# Patient Record
Sex: Male | Born: 1935 | Race: White | Hispanic: No
Health system: Southern US, Community
[De-identification: ages and names within clinical notes are randomized; demographics above are authoritative.]

---

## 2006-11-15 ENCOUNTER — Ambulatory Visit: Payer: Self-pay | Admitting: Ophthalmology

## 2011-01-15 ENCOUNTER — Inpatient Hospital Stay: Payer: Self-pay | Admitting: Internal Medicine

## 2011-03-03 ENCOUNTER — Ambulatory Visit: Payer: Self-pay | Admitting: Internal Medicine

## 2011-03-26 ENCOUNTER — Other Ambulatory Visit: Payer: Self-pay | Admitting: Family Medicine

## 2011-03-26 LAB — URINALYSIS, COMPLETE
Bilirubin,UR: NEGATIVE
Glucose,UR: NEGATIVE mg/dL (ref 0–75)
Ph: 8 (ref 4.5–8.0)
Specific Gravity: 1.017 (ref 1.003–1.030)
Squamous Epithelial: NONE SEEN
WBC UR: 1424 /HPF (ref 0–5)

## 2011-03-29 ENCOUNTER — Inpatient Hospital Stay: Payer: Self-pay | Admitting: Internal Medicine

## 2011-03-29 DIAGNOSIS — E875 Hyperkalemia: Secondary | ICD-10-CM

## 2011-03-29 LAB — CBC
HCT: 46 % (ref 40.0–52.0)
HGB: 15.6 g/dL (ref 13.0–18.0)
MCH: 32.9 pg (ref 26.0–34.0)
MCHC: 33.9 g/dL (ref 32.0–36.0)
Platelet: 266 10*3/uL (ref 150–440)
RBC: 4.73 10*6/uL (ref 4.40–5.90)

## 2011-03-29 LAB — BASIC METABOLIC PANEL
Calcium, Total: 8.7 mg/dL (ref 8.5–10.1)
Chloride: >128 mmol/L — ABNORMAL HIGH (ref 98–107)
Co2: 20 mmol/L — ABNORMAL LOW (ref 21–32)
Creatinine: 7.36 mg/dL — ABNORMAL HIGH (ref 0.60–1.30)
EGFR (African American): 9 — ABNORMAL LOW
EGFR (Non-African Amer.): 8 — ABNORMAL LOW

## 2011-03-29 LAB — URINALYSIS, COMPLETE
Glucose,UR: NEGATIVE mg/dL (ref 0–75)
Nitrite: NEGATIVE
Ph: 7 (ref 4.5–8.0)
Protein: 100
RBC,UR: 347 /HPF (ref 0–5)
Specific Gravity: 1.017 (ref 1.003–1.030)
WBC UR: 1050 /HPF (ref 0–5)

## 2011-03-29 LAB — COMPREHENSIVE METABOLIC PANEL
BUN: 145 mg/dL — ABNORMAL HIGH (ref 7–18)
Bilirubin,Total: 0.3 mg/dL (ref 0.2–1.0)
Calcium, Total: 9.2 mg/dL (ref 8.5–10.1)
Chloride: >128 mmol/L — ABNORMAL HIGH (ref 98–107)
Co2: 20 mmol/L — ABNORMAL LOW (ref 21–32)
Creatinine: 7.97 mg/dL — ABNORMAL HIGH (ref 0.60–1.30)
EGFR (African American): 8 — ABNORMAL LOW
EGFR (Non-African Amer.): 7 — ABNORMAL LOW
Glucose: 164 mg/dL — ABNORMAL HIGH (ref 65–99)
SGOT(AST): 31 U/L (ref 15–37)
SGPT (ALT): 30 U/L
Total Protein: 6.9 g/dL (ref 6.4–8.2)

## 2011-03-29 LAB — CK TOTAL AND CKMB (NOT AT ARMC): CK, Total: 104 U/L (ref 35–232)

## 2011-03-29 LAB — TROPONIN I
Troponin-I: 0.08 ng/mL — ABNORMAL HIGH
Troponin-I: 0.07 ng/mL — ABNORMAL HIGH

## 2011-03-29 LAB — TSH: Thyroid Stimulating Horm: 0.403 u[IU]/mL — ABNORMAL LOW

## 2011-03-30 LAB — CBC WITH DIFFERENTIAL/PLATELET
Basophil #: 0 10*3/uL (ref 0.0–0.1)
Eosinophil #: 0.1 10*3/uL (ref 0.0–0.7)
HCT: 40.8 % (ref 40.0–52.0)
Lymphocyte #: 1.7 10*3/uL (ref 1.0–3.6)
Lymphocyte %: 8 %
MCHC: 32.3 g/dL (ref 32.0–36.0)
MCV: 97 fL (ref 80–100)
Neutrophil #: 19 10*3/uL — ABNORMAL HIGH (ref 1.4–6.5)
Platelet: 239 10*3/uL (ref 150–440)
RBC: 4.21 10*6/uL — ABNORMAL LOW (ref 4.40–5.90)
RDW: 16.4 % — ABNORMAL HIGH (ref 11.5–14.5)

## 2011-03-30 LAB — BASIC METABOLIC PANEL
BUN: 133 mg/dL — ABNORMAL HIGH (ref 7–18)
Calcium, Total: 8.9 mg/dL (ref 8.5–10.1)
Chloride: >128 mmol/L — ABNORMAL HIGH (ref 98–107)
Co2: 21 mmol/L (ref 21–32)
Creatinine: 7.44 mg/dL — ABNORMAL HIGH (ref 0.60–1.30)
EGFR (African American): 12 — ABNORMAL LOW
EGFR (Non-African Amer.): 10 — ABNORMAL LOW
Glucose: 134 mg/dL — ABNORMAL HIGH (ref 65–99)
Glucose: 193 mg/dL — ABNORMAL HIGH (ref 65–99)
Potassium: 5.3 mmol/L — ABNORMAL HIGH (ref 3.5–5.1)
Potassium: 4.1 mmol/L (ref 3.5–5.1)
Sodium: >160 mmol/L (ref 136–145)
Sodium: >160 mmol/L (ref 136–145)

## 2011-03-30 LAB — TROPONIN I: Troponin-I: 0.06 ng/mL — ABNORMAL HIGH

## 2011-03-31 LAB — BASIC METABOLIC PANEL
Anion Gap: 15 (ref 7–16)
BUN: 107 mg/dL — ABNORMAL HIGH (ref 7–18)
Calcium, Total: 7.7 mg/dL — ABNORMAL LOW (ref 8.5–10.1)
Calcium, Total: 7.4 mg/dL — ABNORMAL LOW (ref 8.5–10.1)
Chloride: 121 mmol/L — ABNORMAL HIGH (ref 98–107)
Co2: 20 mmol/L — ABNORMAL LOW (ref 21–32)
Co2: 19 mmol/L — ABNORMAL LOW (ref 21–32)
Creatinine: 3.86 mg/dL — ABNORMAL HIGH (ref 0.60–1.30)
EGFR (African American): 16 — ABNORMAL LOW
EGFR (Non-African Amer.): 13 — ABNORMAL LOW
Glucose: 150 mg/dL — ABNORMAL HIGH (ref 65–99)
Osmolality: 351 (ref 275–301)
Potassium: 3.9 mmol/L (ref 3.5–5.1)
Potassium: 3.6 mmol/L (ref 3.5–5.1)
Sodium: 154 mmol/L — ABNORMAL HIGH (ref 136–145)

## 2011-04-02 LAB — RENAL FUNCTION PANEL
Albumin: 2.2 g/dL — ABNORMAL LOW (ref 3.4–5.0)
Anion Gap: 15 (ref 7–16)
BUN: 55 mg/dL — ABNORMAL HIGH (ref 7–18)
Calcium, Total: 7.9 mg/dL — ABNORMAL LOW (ref 8.5–10.1)
Co2: 16 mmol/L — ABNORMAL LOW (ref 21–32)
EGFR (African American): 32 — ABNORMAL LOW
Glucose: 138 mg/dL — ABNORMAL HIGH (ref 65–99)
Osmolality: 306 (ref 275–301)
Phosphorus: 3.2 mg/dL (ref 2.5–4.9)
Sodium: 145 mmol/L (ref 136–145)

## 2011-04-03 ENCOUNTER — Ambulatory Visit: Payer: Self-pay | Admitting: Internal Medicine

## 2011-04-03 LAB — BASIC METABOLIC PANEL
Anion Gap: 15 (ref 7–16)
Calcium, Total: 8.1 mg/dL — ABNORMAL LOW (ref 8.5–10.1)
Co2: 12 mmol/L — ABNORMAL LOW (ref 21–32)
EGFR (African American): 41 — ABNORMAL LOW
EGFR (Non-African Amer.): 34 — ABNORMAL LOW
Glucose: 105 mg/dL — ABNORMAL HIGH (ref 65–99)
Osmolality: 307 (ref 275–301)
Potassium: 3.7 mmol/L (ref 3.5–5.1)

## 2011-04-03 LAB — CULTURE, BLOOD (SINGLE)

## 2011-04-04 LAB — BASIC METABOLIC PANEL
Chloride: 120 mmol/L — ABNORMAL HIGH (ref 98–107)
Co2: 16 mmol/L — ABNORMAL LOW (ref 21–32)
Creatinine: 2.05 mg/dL — ABNORMAL HIGH (ref 0.60–1.30)
EGFR (Non-African Amer.): 33 — ABNORMAL LOW
Potassium: 4 mmol/L (ref 3.5–5.1)
Sodium: 146 mmol/L — ABNORMAL HIGH (ref 136–145)

## 2011-04-04 LAB — CBC WITH DIFFERENTIAL/PLATELET
Basophil #: 0 10*3/uL (ref 0.0–0.1)
Basophil %: 0.2 %
Eosinophil #: 0.2 10*3/uL (ref 0.0–0.7)
Lymphocyte #: 1.3 10*3/uL (ref 1.0–3.6)
MCH: 31.4 pg (ref 26.0–34.0)
MCHC: 33.4 g/dL (ref 32.0–36.0)
MCV: 94 fL (ref 80–100)
Monocyte #: 0.1 10*3/uL (ref 0.0–0.7)
Monocyte %: 1.8 %
Neutrophil #: 4.2 10*3/uL (ref 1.4–6.5)
Platelet: 139 10*3/uL — ABNORMAL LOW (ref 150–440)
RDW: 15.8 % — ABNORMAL HIGH (ref 11.5–14.5)

## 2011-04-05 LAB — BASIC METABOLIC PANEL
Anion Gap: 14 (ref 7–16)
Calcium, Total: 8.6 mg/dL (ref 8.5–10.1)
Chloride: 120 mmol/L — ABNORMAL HIGH (ref 98–107)
Co2: 15 mmol/L — ABNORMAL LOW (ref 21–32)
Creatinine: 2.06 mg/dL — ABNORMAL HIGH (ref 0.60–1.30)
EGFR (African American): 40 — ABNORMAL LOW
EGFR (Non-African Amer.): 33 — ABNORMAL LOW
Glucose: 116 mg/dL — ABNORMAL HIGH (ref 65–99)
Sodium: 149 mmol/L — ABNORMAL HIGH (ref 136–145)

## 2011-04-06 LAB — BASIC METABOLIC PANEL
BUN: 33 mg/dL — ABNORMAL HIGH (ref 7–18)
Calcium, Total: 8.4 mg/dL — ABNORMAL LOW (ref 8.5–10.1)
Chloride: 114 mmol/L — ABNORMAL HIGH (ref 98–107)
Co2: 17 mmol/L — ABNORMAL LOW (ref 21–32)
EGFR (African American): 36 — ABNORMAL LOW
EGFR (Non-African Amer.): 30 — ABNORMAL LOW
Glucose: 88 mg/dL (ref 65–99)
Osmolality: 294 (ref 275–301)
Potassium: 4 mmol/L (ref 3.5–5.1)
Sodium: 144 mmol/L (ref 136–145)

## 2011-04-06 LAB — CBC WITH DIFFERENTIAL/PLATELET
Basophil #: 0 x10 3/mm 3
Basophil %: 0.2 %
Eosinophil #: 0.2 x10 3/mm 3
Eosinophil %: 4.7 %
HCT: 28 % — ABNORMAL LOW
HGB: 9.7 g/dL — ABNORMAL LOW
Lymphocyte %: 30.6 %
Lymphs Abs: 1.5 x10 3/mm 3
MCH: 32.3 pg
MCHC: 34.4 g/dL
MCV: 94 fL
Monocyte #: 0.3 x10 3/mm 3
Monocyte %: 6.1 %
Neutrophil #: 3 x10 3/mm 3
Neutrophil %: 58.4 %
Platelet: 114 x10 3/mm 3 — ABNORMAL LOW
RBC: 2.99 x10 6/mm 3 — ABNORMAL LOW
RDW: 15 % — ABNORMAL HIGH
WBC: 5 x10 3/mm 3

## 2011-04-07 LAB — RENAL FUNCTION PANEL
Calcium, Total: 8.4 mg/dL — ABNORMAL LOW (ref 8.5–10.1)
Chloride: 111 mmol/L — ABNORMAL HIGH (ref 98–107)
Co2: 23 mmol/L (ref 21–32)
Glucose: 89 mg/dL (ref 65–99)
Phosphorus: 4.4 mg/dL (ref 2.5–4.9)
Potassium: 3.4 mmol/L — ABNORMAL LOW (ref 3.5–5.1)

## 2011-04-09 LAB — CBC WITH DIFFERENTIAL/PLATELET
Basophil %: 0.6 %
Eosinophil #: 0.2 10*3/uL (ref 0.0–0.7)
Eosinophil %: 3.2 %
HCT: 31.1 % — ABNORMAL LOW (ref 40.0–52.0)
HGB: 10.2 g/dL — ABNORMAL LOW (ref 13.0–18.0)
Lymphocyte %: 22.8 %
MCH: 30.9 pg (ref 26.0–34.0)
MCHC: 32.8 g/dL (ref 32.0–36.0)
MCV: 94 fL (ref 80–100)
Monocyte #: 0.5 10*3/uL (ref 0.0–0.7)
Neutrophil #: 3.4 10*3/uL (ref 1.4–6.5)
Neutrophil %: 63.5 %
RBC: 3.3 10*6/uL — ABNORMAL LOW (ref 4.40–5.90)

## 2011-04-09 LAB — BASIC METABOLIC PANEL
Anion Gap: 13 (ref 7–16)
BUN: 32 mg/dL — ABNORMAL HIGH (ref 7–18)
Chloride: 118 mmol/L — ABNORMAL HIGH (ref 98–107)
Creatinine: 2.43 mg/dL — ABNORMAL HIGH (ref 0.60–1.30)
EGFR (Non-African Amer.): 27 — ABNORMAL LOW

## 2011-05-01 ENCOUNTER — Ambulatory Visit: Payer: Self-pay | Admitting: Internal Medicine

## 2011-05-01 DEATH — deceased

## 2013-10-18 IMAGING — CT CT HEAD WITHOUT CONTRAST
2 series · 15 of 30 positions shown, 19 images · non-contrast
Comparison: none

REASON FOR EXAM: ams, fall
COMMENTS:

PROCEDURE:     CT  - CT HEAD WITHOUT CONTRAST  - January 15, 2011  [DATE]
RESULT:     Comparison:  None
TECHNIQUE: Multiple axial images from the foramen magnum to the vertex were
obtained without IV contrast.

[Series 2: without · axial · non-contrast · 0.42mm/px · z∈[-146,-22]mm · 13 of 31 slices shown, 17 images]
[im 3/31  brain]
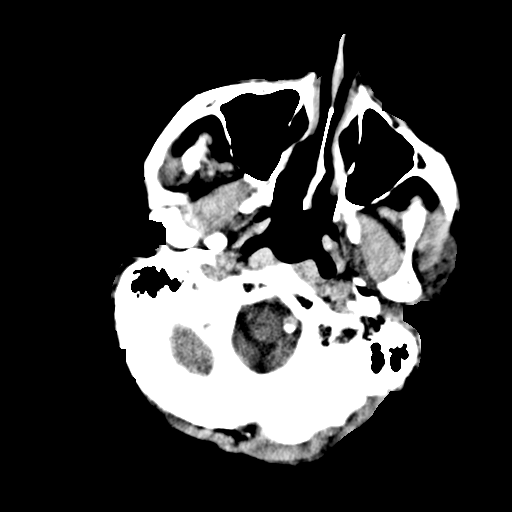
[im 3/31  bone]
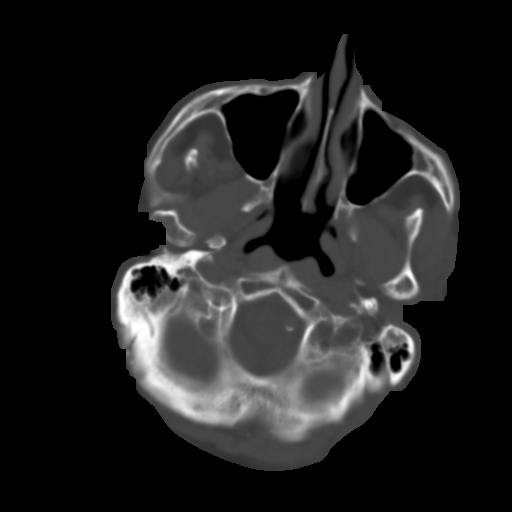
[im 5/31  brain]
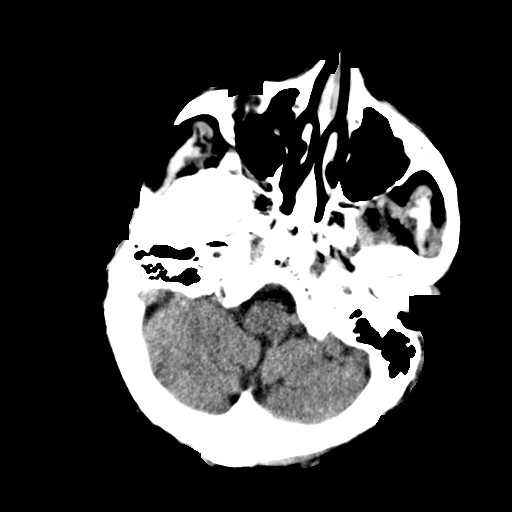
[im 7/31  brain]
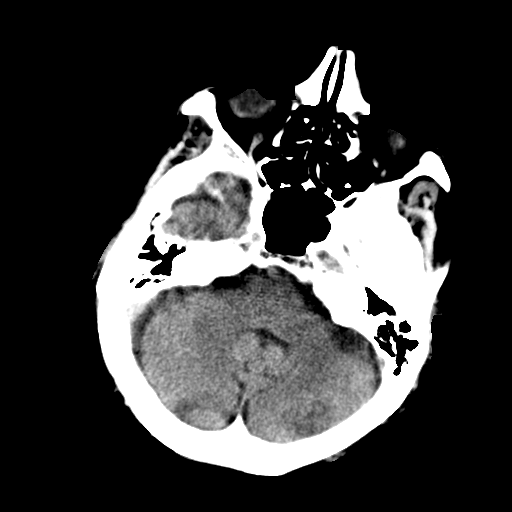
[im 9/31  brain]
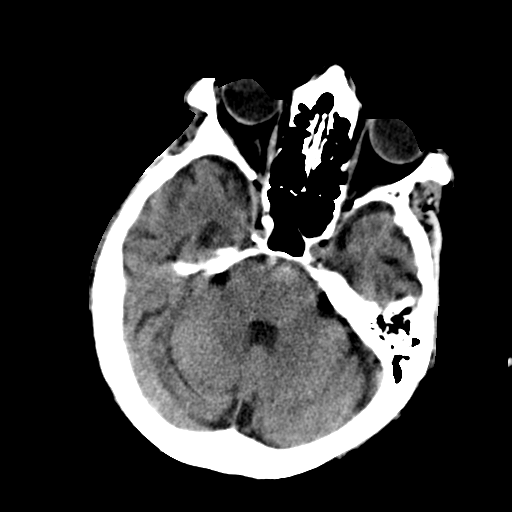
[im 11/31  brain]
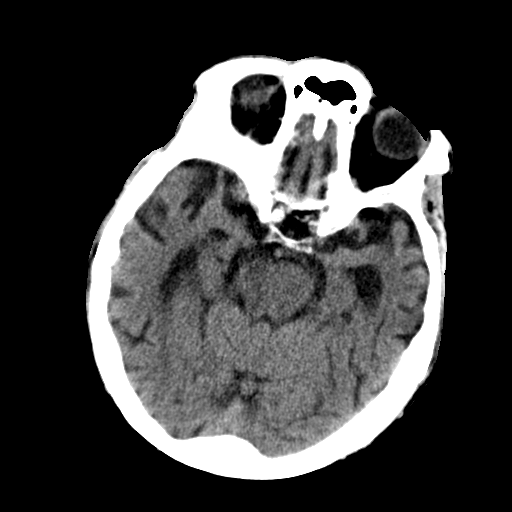
[im 11/31  bone]
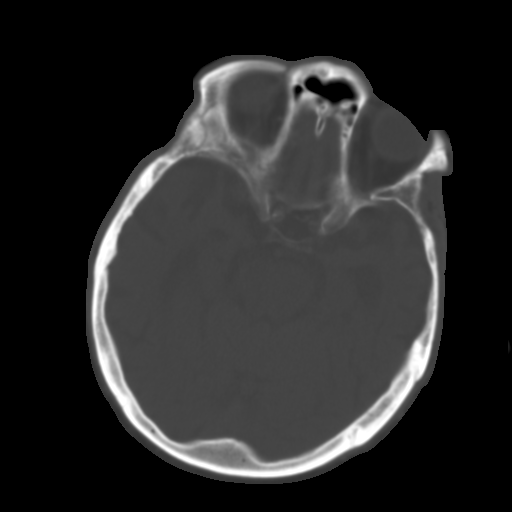
[im 13/31  brain]
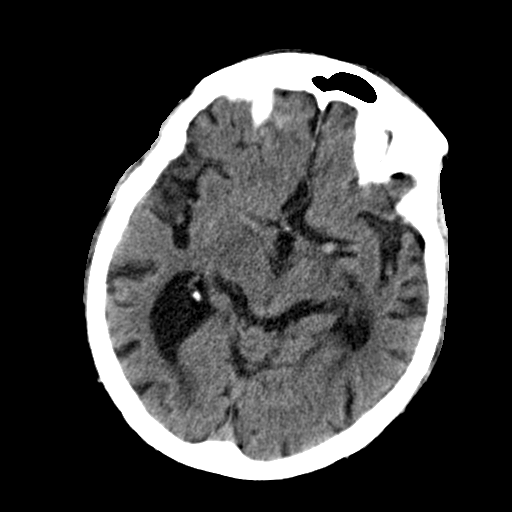
[im 16/31  brain]
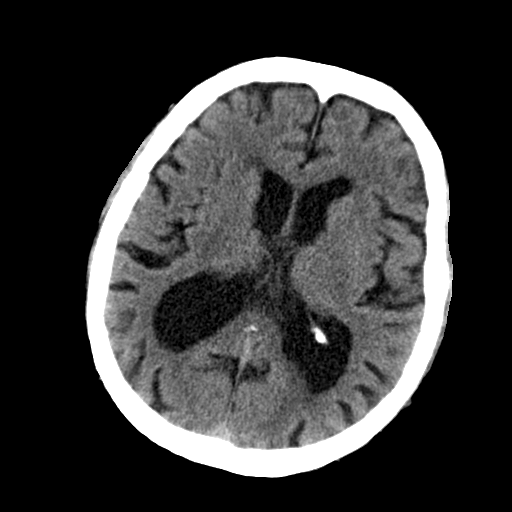
[im 18/31  brain]
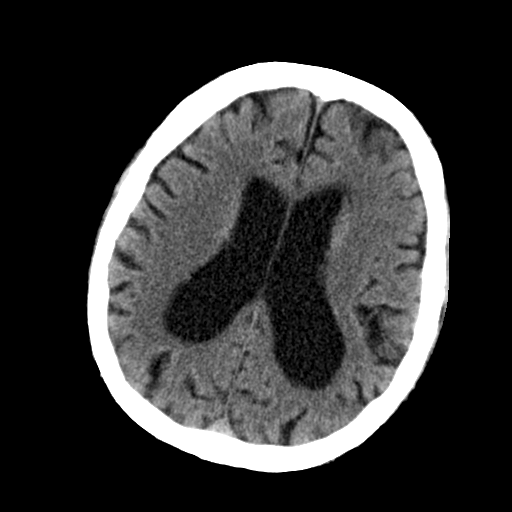
[im 20/31  brain]
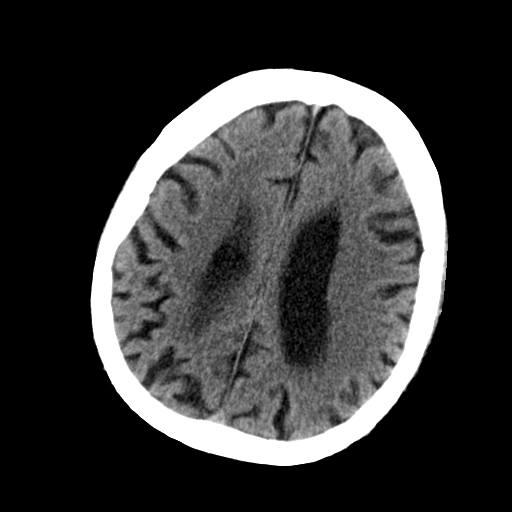
[im 20/31  bone]
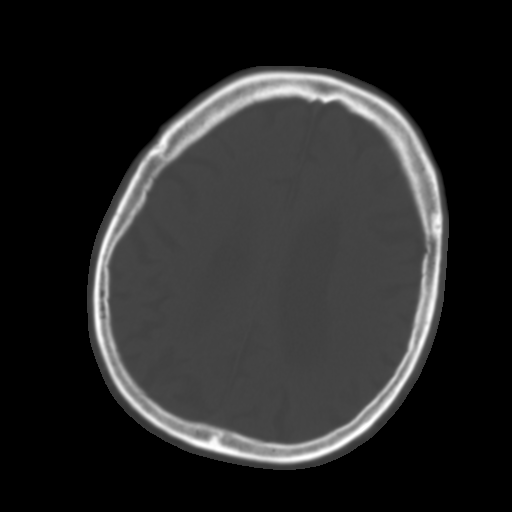
[im 22/31  brain]
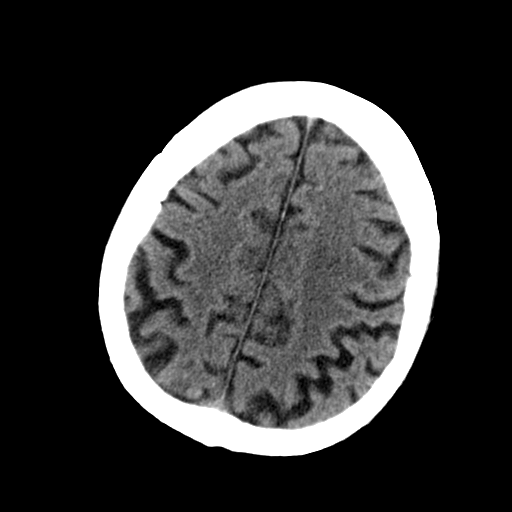
[im 24/31  brain]
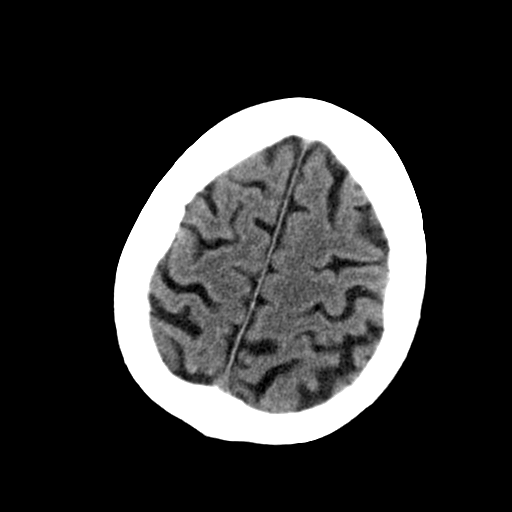
[im 26/31  brain]
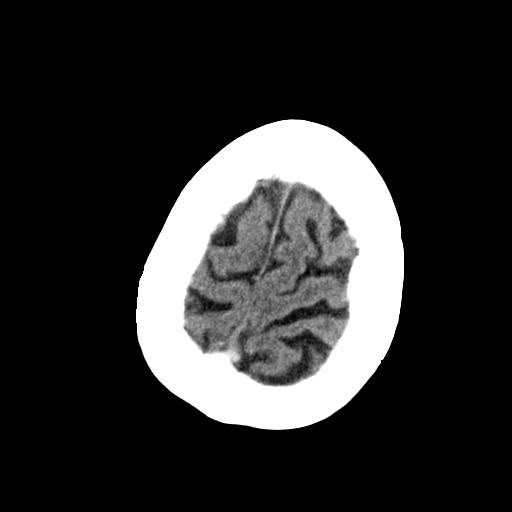
[im 28/31  brain]
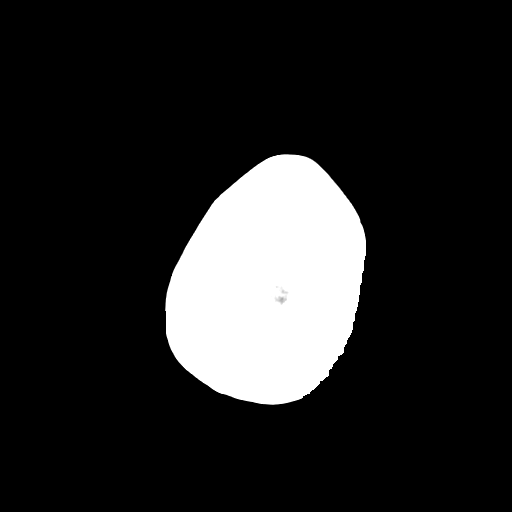
[im 28/31  bone]
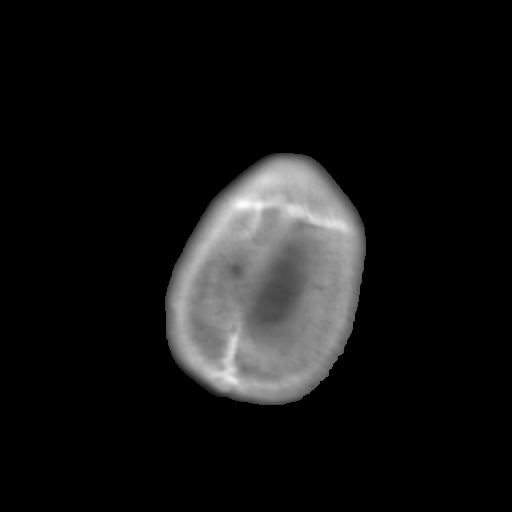

[Series 3: bone · axial · 0.42mm/px · z∈[-146,-126]mm · 2 of 31 slices shown]
[im 3/31  bone]
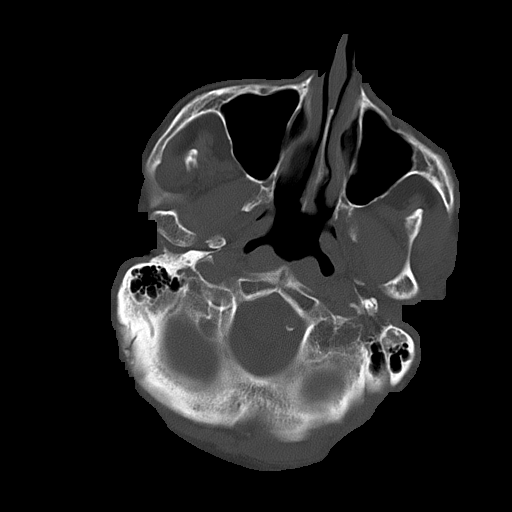
[im 7/31  bone]
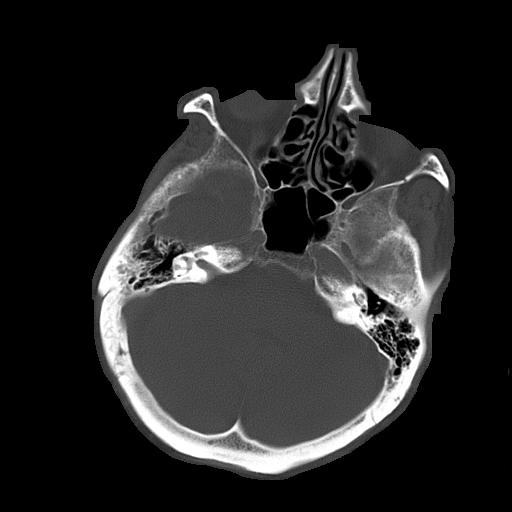

[15 of 30 positions shown; findings below may reference images not displayed]

FINDINGS: There is no evidence of mass effect, midline shift, or extra-axial fluid
collections.  There is no evidence of a space-occupying lesion or
intracranial hemorrhage. There is no evidence of a cortical-based area of
acute infarction. There is generalized cerebral atrophy. There is
periventricular white matter low attenuation likely secondary to
microangiopathy.

The ventricles and sulci are appropriate for the patient's age. The basal
cisterns are patent.

Visualized portions of the orbits are unremarkable. The visualized portions
of the paranasal sinuses and mastoid air cells are unremarkable.
Cerebrovascular atherosclerotic calcifications are noted.

The osseous structures are unremarkable.
IMPRESSION: No acute intracranial process.

## 2014-06-24 NOTE — Discharge Summary (Signed)
PATIENT NAME:  Randall Horn, Randall Horn MR#:  [id number] DATE OF BIRTH:  [birth year]  DATE OF ADMISSION:  03/29/2011 DATE OF DISCHARGE:  04/09/2011  ADMITTING PHYSICIAN: Boneta LucksJennifer Brown, MD  DISCHARGING PHYSICIAN: Larena GlassmanAmir Selmer Adduci, MD  Please refer to Interim Discharge Summary which covers events from 02/27 to 04/03/2011, by Dr. Darrick MeigsSangeeta Panwar. This covers the events from 02/02 to 04/09/2011.   DISCHARGE DIAGNOSES:  1. Acute renal failure, possibly prerenal, with chronic nephrolithiasis.  2. Proteus urinary tract infection. 3. Severe hypernatremia due to dehydration. 4. Dysphagia.  5. Adult failure to thrive. 6. Hypertension. 7. Acidosis.  8. Hypokalemia.  9. Dementia.   CONSULTANTS: Case Management.   STUDIES: Head CT, on 04/05/2011: No acute intracranial process.   HOSPITAL COURSE: In brief, this is an 81 year old white male with past medical history of nephrolithiasis and prostate cancer who presented with previous urinary tract infection with severe acute renal failure.  1. Acute renal failure, possibly prerenal azotemia from nephrolithiasis and urinary tract infection: The patient's creatinine at baseline was around 2. He had CT scan of the abdomen and pelvis that showed bilateral medullary nephrolithiasis. Nephrology was consulted. He was requiring IV fluids and due to severe hypernatremia and dehydration he continued to fluids in order to control that. He was not able to eat properly. He would eat less than 50% of his meals with assistance. Eventually the family requested the patient to go to the [facility]. 2. Proteus urinary tract infection: The patient was treated with Cipro.  3. Hypernatremia due to p.o. free water intake: This was corrected, however, he required IV fluids as the patient was not able to maintain his sodium equilibrium due to poor p.o. intake. Palliative Care was consulted. [facility]  4. Dysphagia, adult failure to thrive, cognitive  problems from dementia, history of stroke, and Parkinson's:  Head CT showed no acute findings.  5. Hypertension: He was continue on Norvasc.  6. Acidosis: The patient received p.o. bicarbonate. 7. Hypokalemia: This was repleted. 8. Deep venous thrombosis prophylaxis: He was maintained on SCDs.   DISPOSITION: The patient was transferred to the hospice home on 04/09/2011.   DISCHARGE MEDICATIONS: 1. Oxygen 2 liters p.r.n.  2. Foley catheter. 3. Roxanol 20 mg/mL, 0.25 to 0.5 mL p.o. sublingual every 1 to 2 hours p.r.n. pain, dyspnea until relief.  4. Lorazepam 0.5 to 1 mg p.o. sublingual every 2 to 4 hours p.r.n. agitation and anxiety. 5. Ranitidine 150 mg p.o. twice a day. 6. ABHR suppository (Ativan) 0.5 mg, (Benadryl) 12.5 mg, (Haldol) 0.5 mg, (Reglan) 10 mg one per rectum every 4 to 6 hours p.r.n. nonobstructive refractory nausea and vomiting.   CODE STATUS: DO NOT RESUSCITATE.   Thank you for allowing me to participate in the care of this patient.   TOTAL TIME SPENT ON DISCHARGE: 45 minutes. ____________________________ Corie ChiquitoAmir A. Lafayette DragonFirozvi, MD aaf:slb D: 04/10/2011 13:48:55 ET    T: 04/10/2011 13:57:57 ET        JOB#: [id number] cc: Karolee OhsAmir A. [place] DragonFirozvi, MD, <Dictator> Ned GraceNancy Phifer, MD Coralyn PearAMIR A Terril Amaro MD ELECTRONICALLY SIGNED 04/10/2011 15:50

## 2014-06-24 NOTE — H&P (Signed)
PATIENT NAME:  Randall Horn, Randall Horn MR#:  [id number] DATE OF BIRTH:  07/31/1921  DATE OF ADMISSION:  03/29/2011  CHIEF COMPLAINT: Decreased p.o. intake.   HISTORY OF PRESENT ILLNESS: Randall Horn was brought to the Emergency Department about three days ago with evidence of a urinary tract infection. A urine culture showed Proteus and he was discharged on ciprofloxacin. He was at [facility] where he was refusing all of his antibiotic administration. He was brought back in today for decreased p.o. intake including food and drinks as well as refusing his antibiotics. He is unable to provide any history secondary to altered mental status.   REVIEW OF SYSTEMS: Unobtainable secondary to altered mental status.   PAST MEDICAL HISTORY:  1. Prostate cancer.  2. GI bleed secondary to peptic ulcer disease. 3. Rheumatoid arthritis. 4. Nephrolithiasis.   FAMILY HISTORY AND SOCIAL HISTORY: Unobtainable secondary to altered mental status.   CODE STATUS: He is a DO NOT RESUSCITATE per his MOST form which was sent from his facility. POA is his son, Ralphael Southgate, who can be reached at [phone].   ALLERGIES: Aspirin and penicillin as well as mycins.   HOME MEDICATIONS:  1. Methotrexate 10 mg every Saturday.  2. Multivitamin. 3. Norvasc 10 mg p.o. daily.  4. Plavix 75 mg p.o. daily.  5. Folate 400 mg p.o. daily.  6. Trazodone 25 mg p.o. at bedtime.  7. Milk of Magnesia p.r.n.  8. Cipro, which he is not taking. 9. Norco 5/325 p.r.n.  10. Tylenol 650 mg p.o. t.i.d.  11. Senna b.i.d.  12. Ativan 0.25 mg p.r.n.   PHYSICAL EXAMINATION:   VITAL SIGNS: On admission temperature 99, heart rate 89, respiratory rate 22, blood pressure 104/97, oxygen sat 99% on room air.   GENERAL: He is well developed but wasted and cachectic. He is in no acute distress.   HEENT: Inspection of the conjunctivae and lids reveals no erythema or pallor. Pupils are equally round and reactive to light. External inspection of the  ears and nose is without lesions or masses. Nasal mucosa, septum, and turbinates are without lesions or masses. Lips and gums are without lesions or masses. He is edentulous. Oropharynx is clear. Oral mucosa is dry. Posterior pharynx and hearing cannot be evaluated secondary to altered mental status.   NECK: Supple. There are no masses. Thyroid is not enlarged. There are no nodules.   LUNGS: He is not in respiratory distress and is not using any accessory muscles of respiration. His lungs are clear throughout.   HEART: Regular. No murmurs, rubs, or gallops. Pedal pulses are 2+ and equal bilaterally.   ABDOMEN: Soft, nontender, nondistended, though there is some tenderness over the suprapubic area. Liver and spleen are not enlarged.   LYMPH: Neck and axilla are free of lymphadenopathy.   EXTREMITIES: He has no clubbing, cyanosis, or edema. Range of motion of all four extremities appears intact, although he cannot participate with the exam. He does have some ecchymosis and swelling over his right shoulder and axillary area.   SKIN: Skin and subcutaneous tissue are without rashes or lesions, though he does have what looks like some early skin breakdown, possibly a stage I sacral decubitus ulcer. Palpation of the skin reveals no induration or nodules.   NEUROLOGIC: Cranial nerves II through XII cannot be evaluated secondary to altered mental status. Deep tendon reflexes are 2+ and equal bilaterally. Judgment and insight are poor. The patient is not oriented but is alert.   LABORATORY DATA: White  blood cell count 23,000, hematocrit 46, platelets 266, sodium 160, potassium 6.0, chloride 128, bicarb 20, BUN 145, creatinine 8, glucose 164, anion gap 12. Troponin 0.08. TSH 0.403. Urinalysis is turbid, 3+ blood, positive for protein, 3+ leukocyte esterase, negative for nitrites, 1050 white blood cells.   ASSESSMENT AND PLAN: This is an 81 year old man with a history of nephrolithiasis and prostate cancer  who presents with Proteus urinary tract infection and severe acute renal failure. 1. Acute renal failure, possibly prerenal, but given the severity this is unlikely the only etiology. Will hydrate and renally dose his medications. Will get a CT scan of the abdomen and pelvis with stone protocol given his history of nephrolithiasis and Proteus in urine culture.  2. Urinary tract infection. He did get a dose of Keflex in the Emergency Department. Will treat with a quinolone as the Proteus is pansensitive.  3. Hyperkalemia likely due to his acute renal failure. Will treat with Kayexalate and recheck a p.m. BMP. EKG is unintelligible and it is being repeated at the time of dictation.  4. Hypernatremia. Will treat with normal saline and recheck a BMP in six hours. All p.o. medications are on hold and the patient is n.p.o. until his mental status improves.   TOTAL TIME SPENT ON CARE AND COORDINATION: 50 minutes.    ____________________________ Lise AuerJennifer L. Manson PasseyBrown, MD jlb:drc D: 03/29/2011 16:45:53 ET T: 03/30/2011 06:14:03 ET JOB#: [id number]  cc: Victorino DikeJennifer L. Manson PasseyBrown, MD, <Dictator> Eber HongJENNIFER L Nikia Mangino MD ELECTRONICALLY SIGNED 03/31/2011 13:37

## 2016-11-04 ENCOUNTER — Other Ambulatory Visit: Payer: Self-pay
